# Patient Record
Sex: Female | Born: 1995 | Race: White | Hispanic: No | Marital: Single | Smoking: Never smoker
Health system: Southern US, Community
[De-identification: ages and names within clinical notes are randomized; demographics above are authoritative.]

## PROBLEM LIST (undated history)

## (undated) DIAGNOSIS — M9110 Juvenile osteochondrosis of head of femur [Legg-Calve-Perthes], unspecified leg: Secondary | ICD-10-CM

## (undated) HISTORY — DX: Juvenile osteochondrosis of head of femur (Legg-Calve-Perthes), unspecified leg: M91.10

---
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
## 2017-04-27 ENCOUNTER — Telehealth: Payer: Self-pay | Admitting: General Practice

## 2017-04-27 NOTE — Telephone Encounter (Signed)
Lm with mom to let her know

## 2017-04-27 NOTE — Telephone Encounter (Signed)
I will accept her 

## 2017-04-27 NOTE — Telephone Encounter (Signed)
This pt recently turned 20 and is looking for a pcp. You see her mother Darnell Level(Cathryn Knightly) and older sister. Her mother would like to know if you would see her as a new pt. Please advise.

## 2017-05-14 ENCOUNTER — Encounter: Payer: BC Managed Care – PPO | Admitting: Internal Medicine

## 2017-05-14 ENCOUNTER — Ambulatory Visit (INDEPENDENT_AMBULATORY_CARE_PROVIDER_SITE_OTHER): Payer: BC Managed Care – PPO | Admitting: Internal Medicine

## 2017-05-14 ENCOUNTER — Other Ambulatory Visit (INDEPENDENT_AMBULATORY_CARE_PROVIDER_SITE_OTHER): Payer: BC Managed Care – PPO

## 2017-05-14 ENCOUNTER — Encounter: Payer: Self-pay | Admitting: Internal Medicine

## 2017-05-14 VITALS — BP 110/76 | HR 75 | Temp 97.9°F | Resp 16 | Ht 65.0 in | Wt 139.0 lb

## 2017-05-14 DIAGNOSIS — Z111 Encounter for screening for respiratory tuberculosis: Secondary | ICD-10-CM | POA: Diagnosis not present

## 2017-05-14 DIAGNOSIS — B009 Herpesviral infection, unspecified: Secondary | ICD-10-CM | POA: Diagnosis not present

## 2017-05-14 DIAGNOSIS — M9111 Juvenile osteochondrosis of head of femur [Legg-Calve-Perthes], right leg: Secondary | ICD-10-CM

## 2017-05-14 DIAGNOSIS — Z0000 Encounter for general adult medical examination without abnormal findings: Secondary | ICD-10-CM

## 2017-05-14 DIAGNOSIS — G43009 Migraine without aura, not intractable, without status migrainosus: Secondary | ICD-10-CM

## 2017-05-14 DIAGNOSIS — G43909 Migraine, unspecified, not intractable, without status migrainosus: Secondary | ICD-10-CM | POA: Insufficient documentation

## 2017-05-14 LAB — COMPREHENSIVE METABOLIC PANEL
ALK PHOS: 58 U/L (ref 47–119)
ALT: 10 U/L (ref 0–35)
AST: 18 U/L (ref 0–37)
Albumin: 4.6 g/dL (ref 3.5–5.2)
BUN: 21 mg/dL (ref 6–23)
CALCIUM: 9.9 mg/dL (ref 8.4–10.5)
CO2: 29 mEq/L (ref 19–32)
Chloride: 104 mEq/L (ref 96–112)
Creatinine, Ser: 0.97 mg/dL (ref 0.40–1.20)
GFR: 79.01 mL/min (ref >60.00)
GLUCOSE: 84 mg/dL (ref 70–99)
POTASSIUM: 4.3 meq/L (ref 3.5–5.1)
Sodium: 138 mEq/L (ref 135–145)
TOTAL PROTEIN: 7.5 g/dL (ref 6.0–8.3)
Total Bilirubin: 0.6 mg/dL (ref 0.3–1.2)

## 2017-05-14 LAB — LIPID PANEL
Cholesterol: 151 mg/dL (ref 0–200)
HDL: 67.9 mg/dL (ref >39.00)
LDL Cholesterol: 70 mg/dL (ref 0–99)
NONHDL: 83.07
TRIGLYCERIDES: 67 mg/dL (ref 0.0–149.0)
Total CHOL/HDL Ratio: 2
VLDL: 13.4 mg/dL (ref 0.0–40.0)

## 2017-05-14 LAB — CBC WITH DIFFERENTIAL/PLATELET
Basophils Absolute: 0 10*3/uL (ref 0.0–0.1)
Basophils Relative: 0.4 % (ref 0.0–3.0)
EOS PCT: 1.4 % (ref 0.0–5.0)
Eosinophils Absolute: 0.1 10*3/uL (ref 0.0–0.7)
HEMATOCRIT: 43.5 % (ref 36.0–49.0)
Hemoglobin: 15.1 g/dL (ref 12.0–16.0)
LYMPHS ABS: 2.9 10*3/uL (ref 0.7–4.0)
Lymphocytes Relative: 39.4 % (ref 24.0–48.0)
MCHC: 34.7 g/dL (ref 31.0–37.0)
MCV: 88.1 fl (ref 78.0–98.0)
MONOS PCT: 8.7 % (ref 3.0–12.0)
Monocytes Absolute: 0.6 10*3/uL (ref 0.1–1.0)
NEUTROS ABS: 3.7 10*3/uL (ref 1.4–7.7)
NEUTROS PCT: 50.1 % (ref 43.0–71.0)
PLATELETS: 287 10*3/uL (ref 150.0–575.0)
RBC: 4.93 Mil/uL (ref 3.80–5.70)
RDW: 12.7 % (ref 11.4–15.5)
WBC: 7.4 10*3/uL (ref 4.5–13.5)

## 2017-05-14 LAB — TSH: TSH: 1.61 u[IU]/mL (ref 0.40–5.00)

## 2017-05-14 NOTE — Progress Notes (Signed)
Subjective:    Patient ID: Melissa Summers, female    DOB: 12/11/1996, 21 y.o.   MRN: [id number]  HPI  She is here to establish with a new pcp.  She is here for a physical exam.   She will be starting college in the fall at [employer].  She will study business with a concentration in management.    Migraines:  They usually occur when she gets her periods.  They can last over a day.  They started this year.  She has had increased stress.  She denies aura.  She had nausea.  She has photosensitivity.  Mom thinks it is related to diet. She has taken advil, but can also suffer through it.    Medications and allergies reviewed with patient and updated if appropriate.  Patient Active Problem List   Diagnosis Date Noted  . 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 Perthe's disease of hip     Current Outpatient Prescriptions on File Prior to Visit  Medication Sig Dispense Refill  . penciclovir (DENAVIR) 1 % cream Apply topically every 2 (two) hours. (Patient taking differently: Apply 1 application topically every 2 (two) hours as needed (cold sores). ) 1.5 g 0  . valACYclovir (VALTREX) 500 MG tablet Take 1 tablet (500 mg total) by mouth daily. 30 tablet 12   No current facility-administered medications on file prior to visit.     Past Medical History:  Diagnosis Date  . Perthe's disease of hip right    Past Surgical History:  Procedure Laterality Date  . HIP SURGERY Right 2009   perthes disease    Social History   Social History  . Marital status: Single    Spouse name: N/A  . Number of children: N/A  . Years of education: N/A   Social History Main Topics  . Smoking status: Never Smoker  . Smokeless tobacco: Never Used  . Alcohol use No  . Drug use: No  . Sexual activity: Not Currently    Birth control/ protection: None   Other Topics Concern  . None   Social History Narrative  . None    Family History  Problem Relation Age of Onset  . Adopted: Yes    Review of  Systems  Constitutional: Negative for chills and fever.  Eyes: Negative for visual disturbance.  Respiratory: Negative for cough, shortness of breath and wheezing.   Cardiovascular: Negative for chest pain, palpitations and leg swelling.  Gastrointestinal: Positive for nausea (migraines). Negative for abdominal pain, blood in stool, constipation and diarrhea.  Genitourinary: Negative for dysuria and hematuria.  Musculoskeletal: Positive for arthralgias (right hip). Negative for back pain.  Skin: Negative for color change and rash.  Neurological: Positive for headaches. Negative for dizziness and light-headedness.  Psychiatric/Behavioral: Negative for dysphoric mood and sleep disturbance. The patient is not nervous/anxious.        Objective:   Vitals:   05/14/17 1544  BP: 110/76  Pulse: 75  Resp: 16  Temp: 97.9 F (36.6 C)   Filed Weights   05/14/17 1544  Weight: 139 lb (63 kg)   Body mass index is 23.13 kg/m.  Wt Readings from Last 3 Encounters:  05/14/17 139 lb (63 kg) (72 %, Z= 0.59)*  01/13/15 150 lb (68 kg) (87 %, Z= 1.13)*  04/06/13 154 lb (69.9 kg) (93 %, Z= 1.44)*   * Growth percentiles are based on CDC 2-20 Years data.     Physical Exam Constitutional: She appears well-developed and well-nourished.  No distress.  HENT:  Head: Normocephalic and atraumatic.  Right Ear: External ear normal. Normal ear canal and TM Left Ear: External ear normal.  Normal ear canal and TM Mouth/Throat: Oropharynx is clear and moist.  Eyes: Conjunctivae and EOM are normal.  Neck: Neck supple. No tracheal deviation present. No thyromegaly present.  No carotid bruit  Cardiovascular: Normal rate, regular rhythm and normal heart sounds.   No murmur heard.  No edema. Pulmonary/Chest: Effort normal and breath sounds normal. No respiratory distress. She has no wheezes. She has no rales.  Breast: deferred  Abdominal: Soft. She exhibits no distension. There is no tenderness.    Lymphadenopathy: She has no cervical adenopathy.  Skin: Skin is warm and dry. She is not diaphoretic.  Psychiatric: She has a normal mood and affect. Her behavior is normal.       Assessment & Plan:   Physical exam: Screening blood work ordered Immunizations  Up to date Gyn  - no currently seeing Exercise  Regular - ice skating Weight  Normal BMI Skin no concerns Substance abuse   none  See Problem List for Assessment and Plan of chronic medical problems.   FU prn

## 2017-05-14 NOTE — Assessment & Plan Note (Addendum)
Flares with activity and weather Has intermittent pain Ices the area [place] in [place]

## 2017-05-14 NOTE — Patient Instructions (Addendum)
Test(s) ordered today. Your results will be released to MyChart (or called to you) after review, usually within 72hours after test completion. If any changes need to be made, you will be notified at that same time.  All other Health Maintenance issues reviewed.   All recommended immunizations and age-appropriate screenings are up-to-date or discussed.  No immunizations administered today.   Medications reviewed and updated.  No changes recommended at this time.        Migraine Headache A migraine headache is an intense, throbbing pain on one side or both sides of the head. Migraines may also cause other symptoms, such as nausea, vomiting, and sensitivity to light and noise. What are the causes? Doing or taking certain things may also trigger migraines, such as:  Alcohol.  Smoking.  Medicines, such as: ? Medicine used to treat chest pain (nitroglycerine). ? Birth control pills. ? Estrogen pills. ? Certain blood pressure medicines.  Aged cheeses, chocolate, or caffeine.  Foods or drinks that contain nitrates, glutamate, aspartame, or tyramine.  Physical activity.  Other things that may trigger a migraine include:  Menstruation.  Pregnancy.  Hunger.  Stress, lack of sleep, too much sleep, or fatigue.  Weather changes.  What increases the risk? The following factors may make you more likely to experience migraine headaches:  Age. Risk increases with age.  Family history of migraine headaches.  Being Caucasian.  Depression and anxiety.  Obesity.  Being a woman.  Having a hole in the heart (patent foramen ovale) or other heart problems.  What are the signs or symptoms? The main symptom of this condition is pulsating or throbbing pain. Pain may:  Happen in any area of the head, such as on one side or both sides.  Interfere with daily activities.  Get worse with physical activity.  Get worse with exposure to bright lights or loud noises.  Other  symptoms may include:  Nausea.  Vomiting.  Dizziness.  General sensitivity to bright lights, loud noises, or smells.  Before you get a migraine, you may get warning signs that a migraine is developing (aura). An aura may include:  Seeing flashing lights or having blind spots.  Seeing bright spots, halos, or zigzag lines.  Having tunnel vision or blurred vision.  Having numbness or a tingling feeling.  Having trouble talking.  Having muscle weakness.  How is this diagnosed? A migraine headache can be diagnosed based on:  Your symptoms.  A physical exam.  Tests, such as CT scan or MRI of the head. These imaging tests can help rule out other causes of headaches.  Taking fluid from the spine (lumbar puncture) and analyzing it (cerebrospinal fluid analysis, or CSF analysis).  How is this treated? A migraine headache is usually treated with medicines that:  Relieve pain.  Relieve nausea.  Prevent migraines from coming back.  Treatment may also include:  Acupuncture.  Lifestyle changes like avoiding foods that trigger migraines.  Follow these instructions at home: Medicines  Take over-the-counter and prescription medicines only as told by your health care provider.  Do not drive or use heavy machinery while taking prescription pain medicine.  To prevent or treat constipation while you are taking prescription pain medicine, your health care provider may recommend that you: ? Drink enough fluid to keep your urine clear or pale yellow. ? Take over-the-counter or prescription medicines. ? Eat foods that are high in fiber, such as fresh fruits and vegetables, whole grains, and beans. ? Limit foods that are high  in fat and processed sugars, such as fried and sweet foods. Lifestyle  Avoid alcohol use.  Do not use any products that contain nicotine or tobacco, such as cigarettes and e-cigarettes. If you need help quitting, ask your health care provider.  Get at  least 8 hours of sleep every night.  Limit your stress. General instructions   Keep a journal to find out what may trigger your migraine headaches. For example, write down: ? What you eat and drink. ? How much sleep you get. ? Any change to your diet or medicines.  If you have a migraine: ? Avoid things that make your symptoms worse, such as bright lights. ? It may help to lie down in a dark, quiet room. ? Do not drive or use heavy machinery. ? Ask your health care provider what activities are safe for you while you are experiencing symptoms.  Keep all follow-up visits as told by your health care provider. This is important. Contact a health care provider if:  You develop symptoms that are different or more severe than your usual migraine symptoms. Get help right away if:  Your migraine becomes severe.  You have a fever.  You have a stiff neck.  You have vision loss.  Your muscles feel weak or like you cannot control them.  You start to lose your balance often.  You develop trouble walking.  You faint. This information is not intended to replace advice given to you by your health care provider. Make sure you discuss any questions you have with your health care provider. Document Released: 11/30/2005 Document Revised: 06/19/2016 Document Reviewed: 05/18/2016 Elsevier Interactive Patient Education  2017 [employer]

## 2017-05-14 NOTE — Progress Notes (Signed)
    Subjective:    Patient ID: Melissa Summers, female    DOB: 19-Sep-1996, 19 y.o.   MRN: [id number]  HPI No show  Medications and allergies reviewed with patient and updated if appropriate.  Patient Active Problem List   Diagnosis Date Noted  . Perthe's disease of hip     Current Outpatient Prescriptions on File Prior to Visit  Medication Sig Dispense Refill  . penciclovir (DENAVIR) 1 % cream Apply topically every 2 (two) hours. (Patient taking differently: Apply 1 application topically every 2 (two) hours as needed (cold sores). ) 1.5 g 0  . valACYclovir (VALTREX) 500 MG tablet Take 1 tablet (500 mg total) by mouth daily. (Patient not taking: Reported on 01/13/2015) 30 tablet 12   No current facility-administered medications on file prior to visit.     Past Medical History:  Diagnosis Date  . Perthe's disease of hip right    Past Surgical History:  Procedure Laterality Date  . HIP SURGERY Right 2009   perthes disease    Social History   Social History  . Marital status: Single    Spouse name: N/A  . Number of children: N/A  . Years of education: N/A   Social History Main Topics  . Smoking status: Never Smoker  . Smokeless tobacco: Not on file  . Alcohol use No  . Drug use: No  . Sexual activity: Not Currently    Birth control/ protection: None   Other Topics Concern  . Not on file   Social History Narrative  . No narrative on file    Family History  Problem Relation Age of Onset  . Adopted: Yes    Review of Systems     Objective:  There were no vitals filed for this visit. There were no vitals filed for this visit. There is no height or weight on file to calculate BMI.  Wt Readings from Last 3 Encounters:  01/13/15 150 lb (68 kg) (87 %, Z= 1.13)*  04/06/13 154 lb (69.9 kg) (93 %, Z= 1.44)*   * Growth percentiles are based on CDC 2-20 Years data.     Physical Exam       Assessment & Plan:       This encounter was created in error  - please disregard.

## 2017-05-15 NOTE — Assessment & Plan Note (Signed)
Gets cold sores intermittently

## 2017-05-15 NOTE — Assessment & Plan Note (Signed)
?   Hormonal, fatigue, stress, dehydration, diet Discussed possible causes and treatment Advised her to try to identify cause She will try excedrin migraine If not effective we can consider a triptan

## 2017-05-17 ENCOUNTER — Telehealth: Payer: Self-pay

## 2017-05-17 ENCOUNTER — Ambulatory Visit (INDEPENDENT_AMBULATORY_CARE_PROVIDER_SITE_OTHER): Payer: BC Managed Care – PPO | Admitting: Emergency Medicine

## 2017-05-17 DIAGNOSIS — Z23 Encounter for immunization: Secondary | ICD-10-CM | POA: Diagnosis not present

## 2017-05-17 LAB — TB SKIN TEST
Induration: 0 mm
TB Skin Test: NEGATIVE

## 2017-05-17 LAB — HSV 2 ANTIBODY, IGG

## 2017-05-17 LAB — HSV 1 ANTIBODY, IGG: HSV 1 GLYCOPROTEIN G AB, IGG: 55.8 {index} — AB (ref <0.90)

## 2017-05-17 MED ORDER — MENINGOCOCCAL A C Y&W-135 OLIG IM SOLR: 0.5000 mL | Freq: Once | INTRAMUSCULAR | Status: DC

## 2017-05-17 MED ORDER — MENINGOCOCCAL A C Y&W-135 OLIG IM SOLR
0.5000 mL | Freq: Once | INTRAMUSCULAR | Status: AC
  Administered 2017-05-17: 0.5 mL via INTRAMUSCULAR

## 2017-05-17 NOTE — Progress Notes (Signed)
Injections given.   Gene Glazebrook J Danniela Mcbrearty, MD  

## 2017-05-17 NOTE — Telephone Encounter (Signed)
Per dr hunter/brassfield and dr tabori-[place]---ok to give 2 live vaccines on same day---those today being bexsero and menveo---I have advised charlotte, NP who was working with dr burns on this

## 2017-05-18 ENCOUNTER — Encounter: Payer: Self-pay | Admitting: Emergency Medicine

## 2017-09-16 ENCOUNTER — Ambulatory Visit (INDEPENDENT_AMBULATORY_CARE_PROVIDER_SITE_OTHER): Payer: BC Managed Care – PPO | Admitting: Emergency Medicine

## 2017-09-16 DIAGNOSIS — Z23 Encounter for immunization: Secondary | ICD-10-CM

## 2017-10-29 ENCOUNTER — Telehealth: Payer: Self-pay | Admitting: Internal Medicine

## 2017-10-29 NOTE — Telephone Encounter (Signed)
Pt called and her Excedrin is not working and she would like the migraine meds that were discussed in July, Pt is aware she may need an appointment, please call back in regard

## 2017-11-01 MED ORDER — RIZATRIPTAN BENZOATE 5 MG PO TABS: 5.0000 mg | ORAL_TABLET | ORAL | 0 refills | Status: DC | PRN

## 2017-11-01 NOTE — Telephone Encounter (Signed)
rx sent to [facility].  Let her know there are other options and there is a stronger dose.    The medication may make her tired, cause dizziness or chest tightness.   If she was to get pregnant she can not take the medication.

## 2017-11-01 NOTE — Telephone Encounter (Signed)
Spoke with pt to inform.  

## 2018-01-26 ENCOUNTER — Telehealth: Payer: Self-pay | Admitting: Internal Medicine

## 2018-01-26 ENCOUNTER — Other Ambulatory Visit: Payer: Self-pay

## 2018-01-26 MED ORDER — RIZATRIPTAN BENZOATE 5 MG PO TABS: 5.0000 mg | ORAL_TABLET | ORAL | 0 refills | Status: DC | PRN

## 2018-01-26 NOTE — Telephone Encounter (Signed)
Copied from CRM [phone]. Topic: Quick Communication - Rx Refill/Question >> Jan 26, 2018  1:25 PM Eston Mouldavis, Cira Deyoe B wrote: Medication:rizatriptan (MAXALT) 5 MG tablet   Has the patient contacted their pharmacy? No  no refills   (Agent: If no, request that the patient contact the pharmacy for the refill.)   Preferred Pharmacy (with phone number or street name): [facility] - [place], [place] - [address] [phone] (Phone) [phone] (Fax)    Agent: Please be advised that RX refills may take up to 3 business days. We ask that you follow-up with your pharmacy.

## 2018-06-29 ENCOUNTER — Encounter: Payer: Self-pay | Admitting: Internal Medicine

## 2018-06-29 ENCOUNTER — Ambulatory Visit: Payer: BC Managed Care – PPO | Admitting: Internal Medicine

## 2018-06-29 DIAGNOSIS — G43009 Migraine without aura, not intractable, without status migrainosus: Secondary | ICD-10-CM | POA: Diagnosis not present

## 2018-06-29 DIAGNOSIS — R509 Fever, unspecified: Secondary | ICD-10-CM | POA: Diagnosis not present

## 2018-06-29 DIAGNOSIS — R112 Nausea with vomiting, unspecified: Secondary | ICD-10-CM | POA: Diagnosis not present

## 2018-06-29 LAB — POCT RAPID STREP A (OFFICE): RAPID STREP A SCREEN: NEGATIVE

## 2018-06-29 MED ORDER — KETOROLAC TROMETHAMINE 60 MG/2ML IM SOLN
60.0000 mg | Freq: Once | INTRAMUSCULAR | Status: AC
  Administered 2018-06-29: 60 mg via INTRAMUSCULAR

## 2018-06-29 MED ORDER — AZITHROMYCIN 250 MG PO TABS: ORAL_TABLET | ORAL | 0 refills | Status: DC

## 2018-06-29 MED ORDER — ONDANSETRON HCL 4 MG PO TABS: 4.0000 mg | ORAL_TABLET | Freq: Three times a day (TID) | ORAL | 0 refills | Status: DC | PRN

## 2018-06-29 MED ORDER — IBUPROFEN 600 MG PO TABS: ORAL_TABLET | ORAL | 0 refills | Status: DC

## 2018-06-29 NOTE — Assessment & Plan Note (Signed)
Zofran Hold BCP Labs

## 2018-06-29 NOTE — Assessment & Plan Note (Addendum)
Worse Toradol IM Ibuprofen prn Hold BCP

## 2018-06-29 NOTE — Progress Notes (Signed)
Subjective:  Patient ID: Melissa Summers, female    DOB: 1996/11/11  Age: 22 y.o. MRN: [id number]  CC: No chief complaint on file.   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 penciclovir (DENAVIR) 1 % cream Apply topically every 2 (two) hours. (Patient taking differently: Apply 1 application topically every 2 (two) hours as needed (cold sores). ) 1.5 g 0  . rizatriptan (MAXALT) 5 MG tablet Take 1 tablet (5 mg total) by mouth as needed for migraine. May repeat in 2 hours if needed 10 tablet 0  . TRI-SPRINTEC 0.18/0.215/0.25 MG-35 MCG tablet TK 1 T PO QD  3  . valACYclovir (VALTREX) 500 MG tablet Take 1 tablet (500 mg total) by mouth daily. 30 tablet 12   No facility-administered medications prior to visit.     ROS: Review of Systems  Constitutional: Positive for chills and fatigue. Negative for activity change, appetite change and unexpected weight change.  HENT: Positive for sore throat. Negative for congestion, mouth sores and sinus pressure.   Eyes: Negative for visual disturbance.  Respiratory: Negative for cough and chest tightness.   Gastrointestinal: Positive for nausea and vomiting. Negative for abdominal pain and diarrhea.  Genitourinary: Negative for difficulty urinating, frequency and vaginal pain.  Musculoskeletal: Negative for back pain and gait problem.  Skin: Negative for pallor and rash.  Neurological: Positive for headaches. Negative for dizziness, tremors, weakness and numbness.  Psychiatric/Behavioral: Negative for confusion and sleep disturbance. The patient is not nervous/anxious.     Objective:  BP 116/78 (BP Location: Left Arm, Patient Position: Sitting, Cuff Size: Normal)   Pulse 87   Temp 99.3 F (37.4 C) (Oral)   Ht 5' 5.04" (1.652 m)    Wt 143 lb (64.9 kg)   SpO2 98%   BMI 23.77 kg/m   BP Readings from Last 3 Encounters:  06/29/18 116/78  05/14/17 110/76  01/13/15 108/66 (44 %, Z = -0.15 /  49 %, Z = -0.02)*   *BP percentiles are based on the August 2017 AAP Clinical Practice Guideline for girls    Wt Readings from Last 3 Encounters:  06/29/18 143 lb (64.9 kg) (73 %, Z= 0.62)*  05/14/17 139 lb (63 kg) (72 %, Z= 0.59)*  01/13/15 150 lb (68 kg) (87 %, Z= 1.13)*   * Growth percentiles are based on CDC (Girls, 2-20 Years) data.    Physical Exam  Constitutional: She appears well-developed. No distress.  HENT:  Head: Normocephalic.  Right Ear: External ear normal.  Left Ear: External ear normal.  Nose: Nose normal.  Mouth/Throat: Oropharynx is clear and moist.  Eyes: Pupils are equal, round, and reactive to light. Conjunctivae are normal. Right eye exhibits no discharge. Left eye exhibits no discharge.  Neck: Normal range of motion. Neck supple. No JVD present. No tracheal deviation present. No thyromegaly present.  Cardiovascular: Normal rate, regular rhythm and normal heart sounds.  Pulmonary/Chest: No stridor. No respiratory distress. She has no wheezes.  Abdominal: Soft. Bowel sounds are normal. She exhibits no distension and no mass. There is no tenderness. There is no rebound and no guarding.  Musculoskeletal: She exhibits no edema or tenderness.  Lymphadenopathy:    She has no cervical adenopathy.  Neurological: She displays normal reflexes.  No cranial nerve deficit. She exhibits normal muscle tone. Coordination normal.  Skin: No rash noted. No erythema.  Psychiatric: She has a normal mood and affect. Her behavior is normal. Judgment and thought content normal.  NAD, nontoxic (-) meningeal signs No HSM cerv adenopathy (+)  Lab Results  Component Value Date   WBC 7.4 05/14/2017   HGB 15.1 05/14/2017   HCT 43.5 05/14/2017   PLT 287.0 05/14/2017   GLUCOSE 84 05/14/2017   CHOL 151 05/14/2017   TRIG  67.0 05/14/2017   HDL 67.90 05/14/2017   LDLCALC 70 05/14/2017   ALT 10 05/14/2017   AST 18 05/14/2017   NA 138 05/14/2017   K 4.3 05/14/2017   CL 104 05/14/2017   CREATININE 0.97 05/14/2017   BUN 21 05/14/2017   CO2 29 05/14/2017   TSH 1.61 05/14/2017    Dg Chest 2 View  Result Date: 01/13/2015 CLINICAL DATA:  Central chest pain, shortness of breath. Symptoms for 3 hr. EXAM: CHEST  2 VIEW COMPARISON:  None. FINDINGS: The cardiomediastinal contours are normal. The lungs are clear. Pulmonary vasculature is normal. No consolidation, pleural effusion, or pneumothorax. No acute osseous abnormalities are seen. IMPRESSION: No acute pulmonary process. Electronically Signed   By: Rubye OaksMelanie  Ehinger M.D.   On: 01/13/2015 23:42    Assessment & Plan:   There are no diagnoses linked to this encounter.   No orders of the defined types were placed in this encounter.    Follow-up: No follow-ups on file.  Sonda PrimesAlex Jabree Pernice, MD

## 2018-06-29 NOTE — Patient Instructions (Signed)
You can use over-the-counter  "cold" medicines  such as "Tylenol cold" , "Advil cold",  "Mucinex" or" Mucinex D"  for cough and congestion.    Use " Delsym" or" Robitussin" cough syrup varietis for cough.  You can use plain "Tylenol" or "Advil" for fever, chills and achyness. Use Halls or Ricola cough drops.   "Viral cold" symptoms are usually triggered by a virus.  The antibiotics are usually not necessary. On average, a" viral cold" illness would take 4-7 days to resolve.   Please, make an appointment if you are not better or if you're worse.  Stop BCP  Go to ER if worse

## 2018-06-29 NOTE — Assessment & Plan Note (Addendum)
Strep test (+) Zpack Mono, CBC, CMET - if not better

## 2018-11-07 ENCOUNTER — Other Ambulatory Visit: Payer: Self-pay | Admitting: Internal Medicine

## 2019-04-27 ENCOUNTER — Other Ambulatory Visit: Payer: Self-pay | Admitting: Internal Medicine

## 2019-05-07 ENCOUNTER — Other Ambulatory Visit: Payer: Self-pay | Admitting: Internal Medicine

## 2019-05-09 NOTE — Telephone Encounter (Signed)
States she would like to speak with her mom before scheduling.  States will call back to schedule virtual med fu.

## 2019-05-09 NOTE — Telephone Encounter (Signed)
Patient needs visit. Last OV with PCP was 2018.

## 2019-05-09 NOTE — Telephone Encounter (Signed)
Patients mom called 5/24 at 10:24 am stating that patient was out of refills.

## 2019-12-19 ENCOUNTER — Ambulatory Visit: Payer: BC Managed Care – PPO | Attending: Internal Medicine

## 2019-12-19 DIAGNOSIS — Z20822 Contact with and (suspected) exposure to covid-19: Secondary | ICD-10-CM

## 2019-12-21 ENCOUNTER — Ambulatory Visit (INDEPENDENT_AMBULATORY_CARE_PROVIDER_SITE_OTHER): Payer: BC Managed Care – PPO | Admitting: Internal Medicine

## 2019-12-21 ENCOUNTER — Ambulatory Visit: Payer: BC Managed Care – PPO | Admitting: Internal Medicine

## 2019-12-21 ENCOUNTER — Encounter: Payer: Self-pay | Admitting: Internal Medicine

## 2019-12-21 ENCOUNTER — Other Ambulatory Visit: Payer: Self-pay

## 2019-12-21 DIAGNOSIS — H10021 Other mucopurulent conjunctivitis, right eye: Secondary | ICD-10-CM | POA: Diagnosis not present

## 2019-12-21 LAB — NOVEL CORONAVIRUS, NAA: SARS-CoV-2, NAA: DETECTED — AB

## 2019-12-21 MED ORDER — ERYTHROMYCIN 5 MG/GM OP OINT: 1.0000 "application " | TOPICAL_OINTMENT | Freq: Every day | OPHTHALMIC | 0 refills | Status: DC

## 2019-12-21 NOTE — Progress Notes (Signed)
Virtual Visit via Video Note  I connected with Melissa Summers on 12/21/19 at  4:00 PM EST by a video enabled telemedicine application and verified that I am speaking with the correct person using two identifiers.  The patient and the provider were at separate locations throughout the entire encounter.   I discussed the limitations of evaluation and management by telemedicine and the availability of in person appointments. The patient expressed understanding and agreed to proceed. The patient and the provider were the only parties present for the visit unless noted in HPI below.  History of Present Illness: The patient is a 24 y.o. female with visit for concern about pink eye. Started yesterday with some redness and itching in the eye. Had drainage which she had to wipe away this morning. Has contacts but using glasses only since this started. Denies pain in the eye. Denies loss of vision. Overall it is worsening. Has tried nothing  Observations/Objective: Appearance: normal, breathing appears normal, right eye with some pinkness in the sclera, no noticeable crusting, glasses, left eye looks fairly normal, casual grooming, abdomen does not appear distended, throat normal, memory normal, mental status is A and O times 3  Assessment and Plan: See problem oriented charting  Follow Up Instructions: rx erythromycin ointment   I discussed the assessment and treatment plan with the patient. The patient was provided an opportunity to ask questions and all were answered. The patient agreed with the plan and demonstrated an understanding of the instructions.   The patient was advised to call back or seek an in-person evaluation if the symptoms worsen or if the condition fails to improve as anticipated.  Myrlene Broker, MD

## 2019-12-22 DIAGNOSIS — H10029 Other mucopurulent conjunctivitis, unspecified eye: Secondary | ICD-10-CM | POA: Insufficient documentation

## 2019-12-22 NOTE — Assessment & Plan Note (Signed)
Rx erythromycin ointment for new problem. Will apply to both eyes as she feels starting itching in the left although no redness on exam. Advised to discard contacts she wore yesterday. Wear glasses until fully resolved. Then start with new contacts.

## 2020-01-08 ENCOUNTER — Telehealth: Payer: BC Managed Care – PPO | Admitting: Nurse Practitioner

## 2020-01-08 DIAGNOSIS — J029 Acute pharyngitis, unspecified: Secondary | ICD-10-CM

## 2020-01-08 MED ORDER — CLINDAMYCIN HCL 300 MG PO CAPS: 300.0000 mg | ORAL_CAPSULE | Freq: Three times a day (TID) | ORAL | 0 refills | Status: DC

## 2020-01-08 NOTE — Progress Notes (Signed)
We are sorry that you are not feeling well.  Here is how we plan to help!  Based on what you have shared with me it is likely that you have strep pharyngitis.  Strep pharyngitis is inflammation and infection in the back of the throat.  This is an infection cause by bacteria and is treated with antibiotics.  I have prescribed Clindamycin 300 mg three times a day for 10 days. For throat pain, we recommend over the counter oral pain relief medications such as acetaminophen or aspirin, or anti-inflammatory medications such as ibuprofen or naproxen sodium. Topical treatments such as oral throat lozenges or sprays may be used as needed. Strep infections are not as easily transmitted as other respiratory infections, however we still recommend that you avoid close contact with loved ones, especially the very young and elderly.  Remember to wash your hands thoroughly throughout the day as this is the number one way to prevent the spread of infection and wipe down door knobs and counters with disinfectant.   Home Care:  Only take medications as instructed by your medical team.  Complete the entire course of an antibiotic.  Do not take these medications with alcohol.  A steam or ultrasonic humidifier can help congestion.  You can place a towel over your head and breathe in the steam from hot water coming from a faucet.  Avoid close contacts especially the very young and the elderly.  Cover your mouth when you cough or sneeze.  Always remember to wash your hands.  Get Help Right Away If:  You develop worsening fever or sinus pain.  You develop a severe head ache or visual changes.  Your symptoms persist after you have completed your treatment plan.  Make sure you  Understand these instructions.  Will watch your condition.  Will get help right away if you are not doing well or get worse.  Your e-visit answers were reviewed by a board certified advanced clinical practitioner to complete your  personal care plan.  Depending on the condition, your plan could have included both over the counter or prescription medications.  If there is a problem please reply  once you have received a response from your provider.  Your safety is important to [place].  If you have drug allergies check your prescription carefully.    You can use MyChart to ask questions about today's visit, request a non-urgent call back, or ask for a work or school excuse for 24 hours related to this e-Visit. If it has been greater than 24 hours you will need to follow up with your provider, or enter a new e-Visit to address those concerns.  You will get an e-mail in the next two days asking about your experience.  I hope that your e-visit has been valuable and will speed your recovery. Thank you for using e-visits.   5-10 minutes spent reviewing and documenting in chart.

## 2020-03-16 ENCOUNTER — Ambulatory Visit: Payer: BC Managed Care – PPO | Attending: Internal Medicine

## 2020-03-16 DIAGNOSIS — Z23 Encounter for immunization: Secondary | ICD-10-CM

## 2020-03-16 NOTE — Progress Notes (Signed)
   Covid-19 Vaccination [facility]  Name:  Valynn Schamberger    MRN: [id number] DOB: [birth year]  03/16/2020  Ms. Hersman was observed post Covid-19 immunization for 15 minutes without incident. She was provided with Vaccine Information Sheet and instruction to access the V-Safe system.   Ms. Basque was instructed to call 911 with any severe reactions post vaccine: Marland Kitchen Difficulty breathing  . Swelling of face and throat  . A fast heartbeat  . A bad rash all over body  . Dizziness and weakness   Immunizations Administered    Name Date Dose VIS Date Route   Pfizer COVID-19 Vaccine 03/16/2020  4:38 PM 0.3 mL 11/24/2019 Intramuscular   Manufacturer: [employer], [employer]   Lot: [id number]   NDC: [address]-3

## 2020-04-09 ENCOUNTER — Ambulatory Visit: Payer: BC Managed Care – PPO

## 2020-04-15 ENCOUNTER — Ambulatory Visit: Payer: BC Managed Care – PPO

## 2020-08-07 ENCOUNTER — Other Ambulatory Visit: Payer: Self-pay | Admitting: Nurse Practitioner

## 2021-02-25 ENCOUNTER — Other Ambulatory Visit: Payer: Self-pay

## 2021-02-25 ENCOUNTER — Encounter: Payer: Self-pay | Admitting: Internal Medicine

## 2021-02-25 ENCOUNTER — Ambulatory Visit (INDEPENDENT_AMBULATORY_CARE_PROVIDER_SITE_OTHER): Payer: BC Managed Care – PPO | Admitting: Internal Medicine

## 2021-02-25 VITALS — BP 108/70 | HR 80 | Temp 98.3°F | Ht 65.0 in | Wt 157.0 lb

## 2021-02-25 DIAGNOSIS — G43009 Migraine without aura, not intractable, without status migrainosus: Secondary | ICD-10-CM | POA: Diagnosis not present

## 2021-02-25 DIAGNOSIS — Z0000 Encounter for general adult medical examination without abnormal findings: Secondary | ICD-10-CM

## 2021-02-25 DIAGNOSIS — Z114 Encounter for screening for human immunodeficiency virus [HIV]: Secondary | ICD-10-CM

## 2021-02-25 DIAGNOSIS — N920 Excessive and frequent menstruation with regular cycle: Secondary | ICD-10-CM

## 2021-02-25 DIAGNOSIS — Z1159 Encounter for screening for other viral diseases: Secondary | ICD-10-CM

## 2021-02-25 LAB — COMPREHENSIVE METABOLIC PANEL
ALT: 15 U/L (ref 0–35)
AST: 20 U/L (ref 0–37)
Albumin: 4.1 g/dL (ref 3.5–5.2)
Alkaline Phosphatase: 50 U/L (ref 39–117)
BUN: 11 mg/dL (ref 6–23)
CO2: 27 mEq/L (ref 19–32)
Calcium: 9.4 mg/dL (ref 8.4–10.5)
Chloride: 105 mEq/L (ref 96–112)
Creatinine, Ser: 0.87 mg/dL (ref 0.40–1.20)
GFR: 94.63 mL/min (ref >60.00)
Glucose, Bld: 89 mg/dL (ref 70–99)
Potassium: 3.9 mEq/L (ref 3.5–5.1)
Sodium: 138 mEq/L (ref 135–145)
Total Bilirubin: 0.8 mg/dL (ref 0.2–1.2)
Total Protein: 7.1 g/dL (ref 6.0–8.3)

## 2021-02-25 LAB — CBC WITH DIFFERENTIAL/PLATELET
Basophils Absolute: 0 10*3/uL (ref 0.0–0.1)
Basophils Relative: 0.5 % (ref 0.0–3.0)
Eosinophils Absolute: 0.1 10*3/uL (ref 0.0–0.7)
Eosinophils Relative: 2 % (ref 0.0–5.0)
HCT: 40.1 % (ref 36.0–46.0)
Hemoglobin: 14.2 g/dL (ref 12.0–15.0)
Lymphocytes Relative: 48 % — ABNORMAL HIGH (ref 12.0–46.0)
Lymphs Abs: 1.9 10*3/uL (ref 0.7–4.0)
MCHC: 35.3 g/dL (ref 30.0–36.0)
MCV: 90.4 fl (ref 78.0–100.0)
Monocytes Absolute: 0.3 10*3/uL (ref 0.1–1.0)
Monocytes Relative: 8.7 % (ref 3.0–12.0)
Neutro Abs: 1.6 10*3/uL (ref 1.4–7.7)
Neutrophils Relative %: 40.8 % — ABNORMAL LOW (ref 43.0–77.0)
Platelets: 245 10*3/uL (ref 150.0–400.0)
RBC: 4.44 Mil/uL (ref 3.87–5.11)
RDW: 12.8 % (ref 11.5–15.5)
WBC: 4 10*3/uL (ref 4.0–10.5)

## 2021-02-25 LAB — LIPID PANEL
Cholesterol: 141 mg/dL (ref 0–200)
HDL: 72.8 mg/dL (ref >39.00)
LDL Cholesterol: 58 mg/dL (ref 0–99)
NonHDL: 68.53
Total CHOL/HDL Ratio: 2
Triglycerides: 55 mg/dL (ref 0.0–149.0)
VLDL: 11 mg/dL (ref 0.0–40.0)

## 2021-02-25 LAB — TSH: TSH: 1.63 u[IU]/mL (ref 0.35–4.50)

## 2021-02-25 MED ORDER — VIENVA 0.1-20 MG-MCG PO TABS: 1.0000 | ORAL_TABLET | Freq: Every day | ORAL | 3 refills | Status: AC

## 2021-02-25 NOTE — Progress Notes (Addendum)
Subjective:    Patient ID: Melissa Summers, female    DOB: 03-10-1996, 25 y.o.   MRN: [id number]   This visit occurred during the SARS-CoV-2 public health emergency.  Safety protocols were in place, including screening questions prior to the visit, additional usage of staff PPE, and extensive cleaning of exam room while observing appropriate contact time as indicated for disinfecting solutions.    HPI She is here for a physical exam.   Had had heavy periods for a while.  They have gotten better recently.  She does worry about her blood counts and iron levels.  She is taking her birth control.  She would like to ideally get a refill.  She is up-to-date with her gynecologist  Medications and allergies reviewed with patient and updated if appropriate.  Patient Active Problem List   Diagnosis Date Noted  . Nausea & vomiting 06/29/2018  . 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 Migraines 05/14/2017  . Juvenile osteochondrosis of head of femur     Current Outpatient Medications on File Prior to Visit  Medication Sig Dispense Refill  . VIENVA 0.1-20 MG-MCG tablet Take 1 tablet by mouth daily.     No current facility-administered medications on file prior to visit.    Past Medical History:  Diagnosis Date  . Perthe's disease of hip right    Past Surgical History:  Procedure Laterality Date  . HIP SURGERY Right 2009   perthes disease    Social History   Socioeconomic History  . Marital status: Single    Spouse name: Not on file  . Number of children: Not on file  . Years of education: Not on file  . Highest education level: Not on file  Occupational History  . Not on file  Tobacco Use  . Smoking status: Never Smoker  . Smokeless tobacco: Never Used  Substance and Sexual Activity  . Alcohol use: No  . Drug use: No  . Sexual activity: Not Currently    Birth control/protection: None  Other Topics Concern  . Not on file  Social History Narrative  . Not on file   Social  Determinants of Health   Financial Resource Strain: Not on file  Food Insecurity: Not on file  Transportation Needs: Not on file  Physical Activity: Not on file  Stress: Not on file  Social Connections: Not on file    Family History  Adopted: Yes    Review of Systems  Constitutional: Negative for chills and fever.  Eyes: Negative for visual disturbance.  Respiratory: Negative for cough, shortness of breath and wheezing.   Cardiovascular: Negative for chest pain, palpitations and leg swelling.  Gastrointestinal: Negative for abdominal pain, blood in stool, constipation, diarrhea and nausea.       No gerd  Genitourinary: Positive for menstrual problem (heavy). Negative for dysuria.  Musculoskeletal: Positive for arthralgias (hip). Negative for back pain.  Skin: Negative for color change and rash.  Neurological: Positive for headaches (occ). Negative for light-headedness.  Psychiatric/Behavioral: Negative for dysphoric mood. The patient is nervous/anxious.        Objective:   Vitals:   02/25/21 0900  BP: 108/70  Pulse: 80  Temp: 98.3 F (36.8 C)  SpO2: 97%   Filed Weights   02/25/21 0900  Weight: 157 lb (71.2 kg)   Body mass index is 26.13 kg/m.  BP Readings from Last 3 Encounters:  02/25/21 108/70  06/29/18 116/78  05/14/17 110/76    Wt Readings from Last 3 Encounters:  02/25/21 157 lb (71.2 kg)  06/29/18 143 lb (64.9 kg) (73 %, Z= 0.62)*  05/14/17 139 lb (63 kg) (72 %, Z= 0.59)*   * Growth percentiles are based on CDC (Girls, 2-20 Years) data.    Depression screen PHQ 2/9 02/25/2021  Decreased Interest 1  Down, Depressed, Hopeless 0  PHQ - 2 Score 1  Altered sleeping 1  Tired, decreased energy 1  Change in appetite 2  Feeling bad or failure about yourself  0  Trouble concentrating 0  Moving slowly or fidgety/restless 0  Suicidal thoughts 0  PHQ-9 Score 5    GAD 7 : Generalized Anxiety Score 02/25/2021  Nervous, Anxious, on Edge 2  Control/stop  worrying 2  Worry too much - different things 2  Trouble relaxing 1  Restless 0  Easily annoyed or irritable 2  Afraid - awful might happen 0  Total GAD 7 Score 9  Anxiety Difficulty Not difficult at all       Physical Exam Constitutional: She appears well-developed and well-nourished. No distress.  HENT:  Head: Normocephalic and atraumatic.  Right Ear: External ear normal. Normal ear canal and TM Left Ear: External ear normal.  Normal ear canal and TM Mouth/Throat: Oropharynx is clear and moist.  Eyes: Conjunctivae and EOM are normal.  Neck: Neck supple. No tracheal deviation present. No thyromegaly present.  No carotid bruit  Cardiovascular: Normal rate, regular rhythm and normal heart sounds.   No murmur heard.  No edema. Pulmonary/Chest: Effort normal and breath sounds normal. No respiratory distress. She has no wheezes. She has no rales.  Breast: deferred   Abdominal: Soft. She exhibits no distension. There is no tenderness.  Lymphadenopathy: She has no cervical adenopathy.  Skin: Skin is warm and dry. She is not diaphoretic.  Psychiatric: She has a normal mood and affect. Her behavior is normal.        Assessment & Plan:   Physical exam: Screening blood work    ordered Immunizations  tdap due, other up to date Gyn   Last one year ago Exercise  Skate 2-3 times a week, walking Weight  Ok - slightly overweight Substance abuse        See Problem List for Assessment and Plan of chronic medical problems.

## 2021-02-25 NOTE — Assessment & Plan Note (Signed)
Resolved after having a wisdom teeth removed

## 2021-02-25 NOTE — Assessment & Plan Note (Signed)
Had an episode of heavy periods recently-have improved some Concern regarding blood counts, iron levels-we will check both today Continue current birth control-refilled for 1 year

## 2021-02-25 NOTE — Patient Instructions (Addendum)
Blood work was ordered.     Medications changes include :  none   Your prescription(s) have been submitted to your pharmacy. Please take as directed and contact our office if you believe you are having problem(s) with the medication(s).       Health Maintenance, Female Adopting a healthy lifestyle and getting preventive care are important in promoting health and wellness. Ask your health care provider about:  The right schedule for you to have regular tests and exams.  Things you can do on your own to prevent diseases and keep yourself healthy. What should I know about diet, weight, and exercise? Eat a healthy diet  Eat a diet that includes plenty of vegetables, fruits, low-fat dairy products, and lean protein.  Do not eat a lot of foods that are high in solid fats, added sugars, or sodium.   Maintain a healthy weight Body mass index (BMI) is used to identify weight problems. It estimates body fat based on height and weight. Your health care provider can help determine your BMI and help you achieve or maintain a healthy weight. Get regular exercise Get regular exercise. This is one of the most important things you can do for your health. Most adults should:  Exercise for at least 150 minutes each week. The exercise should increase your heart rate and make you sweat (moderate-intensity exercise).  Do strengthening exercises at least twice a week. This is in addition to the moderate-intensity exercise.  Spend less time sitting. Even light physical activity can be beneficial. Watch cholesterol and blood lipids Have your blood tested for lipids and cholesterol at 23 years of age, then have this test every 5 years. Have your cholesterol levels checked more often if:  Your lipid or cholesterol levels are high.  You are older than 23 years of age.  You are at high risk for heart disease. What should I know about cancer screening? Depending on your health history and family  history, you may need to have cancer screening at various ages. This may include screening for:  Breast cancer.  Cervical cancer.  Colorectal cancer.  Skin cancer.  Lung cancer. What should I know about heart disease, diabetes, and high blood pressure? Blood pressure and heart disease  High blood pressure causes heart disease and increases the risk of stroke. This is more likely to develop in people who have high blood pressure readings, are of African descent, or are overweight.  Have your blood pressure checked: ? Every 3-5 years if you are 13-34 years of age. ? Every year if you are 28 years old or older. Diabetes Have regular diabetes screenings. This checks your fasting blood sugar level. Have the screening done:  Once every three years after age 74 if you are at a normal weight and have a low risk for diabetes.  More often and at a younger age if you are overweight or have a high risk for diabetes. What should I know about preventing infection? Hepatitis B If you have a higher risk for hepatitis B, you should be screened for this virus. Talk with your health care provider to find out if you are at risk for hepatitis B infection. Hepatitis C Testing is recommended for:  Everyone born from [birth year] through 1963.  Anyone with known risk factors for hepatitis C. Sexually transmitted infections (STIs)  Get screened for STIs, including gonorrhea and chlamydia, if: ? You are sexually active and are younger than 23 years of age. ? You are older  than 23 years of age and your health care provider tells you that you are at risk for this type of infection. ? Your sexual activity has changed since you were last screened, and you are at increased risk for chlamydia or gonorrhea. Ask your health care provider if you are at risk.  Ask your health care provider about whether you are at high risk for HIV. Your health care provider may recommend a prescription medicine to help prevent HIV  infection. If you choose to take medicine to prevent HIV, you should first get tested for HIV. You should then be tested every 3 months for as long as you are taking the medicine. Pregnancy  If you are about to stop having your period (premenopausal) and you may become pregnant, seek counseling before you get pregnant.  Take 400 to 800 micrograms (mcg) of folic acid every day if you become pregnant.  Ask for birth control (contraception) if you want to prevent pregnancy. Osteoporosis and menopause Osteoporosis is a disease in which the bones lose minerals and strength with aging. This can result in bone fractures. If you are 6 years old or older, or if you are at risk for osteoporosis and fractures, ask your health care provider if you should:  Be screened for bone loss.  Take a calcium or vitamin D supplement to lower your risk of fractures.  Be given hormone replacement therapy (HRT) to treat symptoms of menopause. Follow these instructions at home: Lifestyle  Do not use any products that contain nicotine or tobacco, such as cigarettes, e-cigarettes, and chewing tobacco. If you need help quitting, ask your health care provider.  Do not use street drugs.  Do not share needles.  Ask your health care provider for help if you need support or information about quitting drugs. Alcohol use  Do not drink alcohol if: ? Your health care provider tells you not to drink. ? You are pregnant, may be pregnant, or are planning to become pregnant.  If you drink alcohol: ? Limit how much you use to 0-1 drink a day. ? Limit intake if you are breastfeeding.  Be aware of how much alcohol is in your drink. In the [place] one drink equals one 12 oz bottle of beer (355 mL), one 5 oz glass of wine (148 mL), or one 1 oz glass of hard liquor (44 mL). General instructions  Schedule regular health, dental, and eye exams.  Stay current with your vaccines.  Tell your health care provider if: ? You  often feel depressed. ? You have ever been abused or do not feel safe at home. Summary  Adopting a healthy lifestyle and getting preventive care are important in promoting health and wellness.  Follow your health care provider's instructions about healthy diet, exercising, and getting tested or screened for diseases.  Follow your health care provider's instructions on monitoring your cholesterol and blood pressure. This information is not intended to replace advice given to you by your health care provider. Make sure you discuss any questions you have with your health care provider. Document Revised: 11/23/2018 Document Reviewed: 11/23/2018 Elsevier Patient Education  2021 [employer].

## 2021-02-26 LAB — IRON,TIBC AND FERRITIN PANEL
%SAT: 40 % (calc) (ref 16–45)
Ferritin: 32 ng/mL (ref 16–154)
Iron: 131 ug/dL (ref 40–190)
TIBC: 331 mcg/dL (calc) (ref 250–450)

## 2021-02-26 LAB — HIV ANTIBODY (ROUTINE TESTING W REFLEX): HIV 1&2 Ab, 4th Generation: NONREACTIVE

## 2021-02-26 LAB — HEPATITIS C ANTIBODY
Hepatitis C Ab: NONREACTIVE
SIGNAL TO CUT-OFF: 0.01 (ref <1.00)

## 2021-09-02 ENCOUNTER — Telehealth (INDEPENDENT_AMBULATORY_CARE_PROVIDER_SITE_OTHER): Payer: BC Managed Care – PPO | Admitting: Family Medicine

## 2021-09-02 ENCOUNTER — Encounter: Payer: Self-pay | Admitting: Family Medicine

## 2021-09-02 DIAGNOSIS — R21 Rash and other nonspecific skin eruption: Secondary | ICD-10-CM | POA: Diagnosis not present

## 2021-09-02 DIAGNOSIS — R6889 Other general symptoms and signs: Secondary | ICD-10-CM

## 2021-09-02 NOTE — Patient Instructions (Addendum)
   ---------------------------------------------------------------------------------------------------------------------------      WORK SLIP:  Patient Melissa Summers,  02-18-1996, was seen for a medical visit today, 09/02/21 . Please excuse from work for a COVID like illness. We advise 10 days minimum from the onset of symptoms (08/30/21) PLUS 1 day of no fever and improved symptoms. Will defer to employer for a sooner return to work if symptoms have resolved, it is greater than 5 days since the positive test and the patient can wear a high-quality, tight fitting mask such as N95 or KN95 at all times for an additional 5 days. Would also suggest COVID19 antigen testing is negative prior to return.  Sincerely: E-signature: Dr. Kriste Basque, DO [facility] Ph: [phone]   ------------------------------------------------------------------------------------------------------------------------------   HOME CARE TIPS:  Melissa Summers COVID19 testing information: [email or web address] [place] [phone] Most pharmacies also offer testing and home test kits.   -IF the covid test is negative, you should go to a walk in clinic or urgent care for flu/strept testing and further evaluation of the rash.  -can use tylenol or aleve if needed for fevers, aches and pains per instructions  -can use nasal saline a few times per day if you have nasal congestion;   -stay hydrated, drink plenty of fluids and eat small healthy meals - avoid dairy  -can take 1000 IU ( ) Vit D3 and 100-500 mg of Vit C daily per instructions  -If the Covid test is positive, check out the [facility] website for more information on home care, transmission and treatment for COVID19  -follow up with your doctor in 2-3 days unless improving and feeling better  -stay home while sick, except to seek medical care. If you have COVID19, ideally it would be best to stay home for a  full 10 days since the onset of symptoms PLUS one day of no fever and feeling better. Wear a good mask that fits snugly (such as N95 or KN95) if around others to reduce the risk of transmission.  It was nice to meet you today, and I really hope you are feeling better soon. I help Melissa Summers out with telemedicine visits on Tuesdays and Thursdays and am available for visits on those days. If you have any concerns or questions following this visit please schedule a follow up visit with your Primary Care doctor or seek care at a local urgent care clinic to avoid delays in care.    Seek in person care or schedule a follow up video visit promptly if your symptoms worsen, new concerns arise or you are not improving with treatment. Call 911 and/or seek emergency care if your symptoms are severe or life threatening.

## 2021-09-02 NOTE — Progress Notes (Signed)
Virtual Visit via Video Note  I connected with Tresa Endo  on 09/02/21 at 11:00 AM EDT by a video enabled telemedicine application and verified that I am speaking with the correct person using two identifiers.  Location patient: home, [place] Location provider:work or home office Persons participating in the virtual visit: patient, provider  I discussed the limitations of evaluation and management by telemedicine and the availability of in person appointments. The patient expressed understanding and agreed to proceed.   HPI:  Acute telemedicine visit for 3 days ago: -Onset: about 3 days ago -no known sick contacts with HFM, MP, covid, measles, varicella, etc., tick bites -Symptoms include:fever, body aches, HA, sore throat, chills, feet, vomiting, diarrhea -she was at a water park and thinks got some bites on R wrist and R foot and Let foot and ankle - itchy red bumps, one she scratched a lot got some pus and she put antibacterial ointment and now it is much better -Denies: CP, SOB, inability to eat/drink/get out of bed -Has tried:nothing -Pertinent past medical history: see below -Pertinent medication allergies:  Allergies  Allergen Reactions   Amoxicillin Hives  -COVID-19 vaccine status: up to day on covid, varicella and other vaccines -only one sexual partner, has been in situations where she was dancing very close to others -denies any chance of pregnancy, neg pregnancy test last night  ROS: See pertinent positives and negatives per HPI.  Past Medical History:  Diagnosis Date   Perthe's disease of hip right    Past Surgical History:  Procedure Laterality Date   HIP SURGERY Right 2009   perthes disease     Current Outpatient Medications:    VIENVA 0.1-20 MG-MCG tablet, Take 1 tablet by mouth daily., Disp: 84 tablet, Rfl: 3  EXAM:  VITALS per patient if applicable:  GENERAL: alert, oriented, appears well and in no acute distress  HEENT: atraumatic, conjunttiva clear, no  obvious abnormalities on inspection of external nose and ears  NECK: normal movements of the head and neck  LUNGS: on inspection no signs of respiratory distress, breathing rate appears normal, no obvious gross SOB, gasping or wheezing  CV: no obvious cyanosis  SKIN: 3 small erythematous papules R wrist in irr pattern in various healing stages; few scattered erythematous papules ankles/dorsal foot in various stages of healing  MS: moves all visible extremities without noticeable abnormality  PSYCH/NEURO: pleasant and cooperative, no obvious depression or anxiety, speech and thought processing grossly intact  ASSESSMENT AND PLAN:  Discussed the following assessment and plan:  Flu-like symptoms  Rash  -we discussed possible serious and likely etiologies, options for evaluation and workup, limitations of telemedicine visit vs in person visit, treatment, treatment risks and precautions. Query covid, flu, vs other. Likely insect bites on wrist and ankles. Discussed other possible rashes associated with febrile illness as we. She opted to do covid testing and if neg she is considering going to [facility] for further eval rash flu/strep testing et. In interim symptomatic care and work note provided.  Advised to seek prompt in person care if worsening, new symptoms arise, or if is not improving with treatment. Discussed options for inperson care if PCP office not available. Did let this patient know that I only do telemedicine on Tuesdays and Thursdays for [place]. Advised to schedule follow up visit with PCP or [facility] if any further questions or concerns to avoid delays in care.   I discussed the assessment and treatment plan with the patient. The patient was provided an opportunity  to ask questions and all were answered. The patient agreed with the plan and demonstrated an understanding of the instructions.     Lucretia Kern, DO

## 2021-09-05 ENCOUNTER — Encounter: Payer: Self-pay | Admitting: Family Medicine

## 2021-09-05 ENCOUNTER — Telehealth: Payer: BC Managed Care – PPO | Admitting: Internal Medicine

## 2021-09-16 ENCOUNTER — Telehealth: Payer: BC Managed Care – PPO | Admitting: Family Medicine

## 2022-08-11 ENCOUNTER — Encounter: Payer: Self-pay | Admitting: Internal Medicine

## 2022-08-11 NOTE — Patient Instructions (Addendum)
  Blood work was ordered.     Medications changes include :   fosamax 70 mg weekly   Your prescription(s) have been sent to your pharmacy.     Return in about 1 year (around 08/13/2023) for Physical Exam.     Health Maintenance, Female Adopting a healthy lifestyle and getting preventive care are important in promoting health and wellness. Ask your health care provider about: The right schedule for you to have regular tests and exams. Things you can do on your own to prevent diseases and keep yourself healthy. What should I know about diet, weight, and exercise? Eat a healthy diet  Eat a diet that includes plenty of vegetables, fruits, low-fat dairy products, and lean protein. Do not eat a lot of foods that are high in solid fats, added sugars, or sodium. Maintain a healthy weight Body mass index (BMI) is used to identify weight problems. It estimates body fat based on height and weight. Your health care provider can help determine your BMI and help you achieve or maintain a healthy weight. Get regular exercise Get regular exercise. This is one of the most important things you can do for your health. Most adults should: Exercise for at least 150 minutes each week. The exercise should increase your heart rate and make you sweat (moderate-intensity exercise). Do strengthening exercises at least twice a week. This is in addition to the moderate-intensity exercise. Spend less time sitting. Even light physical activity can be beneficial. Watch cholesterol and blood lipids Have your blood tested for lipids and cholesterol at 24 years of age, then have this test every 5 years. Have your cholesterol levels checked more often if: Your lipid or cholesterol levels are high. You are older than 24 years of age. You are at high risk for heart disease. What should I know about cancer screening? Depending on your health history and family history, you may need to have cancer screening at  various ages. This may include screening for: Breast cancer. Cervical cancer. Colorectal cancer. Skin cancer. Lung cancer. What should I know about heart disease, diabetes, and high blood pressure? Blood pressure and heart disease High blood pressure causes heart disease and increases the risk of stroke. This is more likely to develop in people who have high blood pressure readings or are overweight. Have your blood pressure checked: Every 3-5 years if you are 18-39 years of age. Every year if you are 40 years old or older. Diabetes Have regular diabetes screenings. This checks your fasting blood sugar level. Have the screening done: Once every three years after age 40 if you are at a normal weight and have a low risk for diabetes. More often and at a younger age if you are overweight or have a high risk for diabetes. What should I know about preventing infection? Hepatitis B If you have a higher risk for hepatitis B, you should be screened for this virus. Talk with your health care provider to find out if you are at risk for hepatitis B infection. Hepatitis C Testing is recommended for: Everyone born from 1945 through 1965. Anyone with known risk factors for hepatitis C. Sexually transmitted infections (STIs) Get screened for STIs, including gonorrhea and chlamydia, if: You are sexually active and are younger than 24 years of age. You are older than 24 years of age and your health care provider tells you that you are at risk for this type of infection. Your sexual activity has changed since you were last   screened, and you are at increased risk for chlamydia or gonorrhea. Ask your health care provider if you are at risk. Ask your health care provider about whether you are at high risk for HIV. Your health care provider may recommend a prescription medicine to help prevent HIV infection. If you choose to take medicine to prevent HIV, you should first get tested for HIV. You should then be  tested every 3 months for as long as you are taking the medicine. Pregnancy If you are about to stop having your period (premenopausal) and you may become pregnant, seek counseling before you get pregnant. Take 400 to 800 micrograms (mcg) of folic acid every day if you become pregnant. Ask for birth control (contraception) if you want to prevent pregnancy. Osteoporosis and menopause Osteoporosis is a disease in which the bones lose minerals and strength with aging. This can result in bone fractures. If you are 65 years old or older, or if you are at risk for osteoporosis and fractures, ask your health care provider if you should: Be screened for bone loss. Take a calcium or vitamin D supplement to lower your risk of fractures. Be given hormone replacement therapy (HRT) to treat symptoms of menopause. Follow these instructions at home: Alcohol use Do not drink alcohol if: Your health care provider tells you not to drink. You are pregnant, may be pregnant, or are planning to become pregnant. If you drink alcohol: Limit how much you have to: 0-1 drink a day. Know how much alcohol is in your drink. In the [place] one drink equals one 12 oz bottle of beer (355 mL), one 5 oz glass of wine (148 mL), or one 1 oz glass of hard liquor (44 mL). Lifestyle Do not use any products that contain nicotine or tobacco. These products include cigarettes, chewing tobacco, and vaping devices, such as e-cigarettes. If you need help quitting, ask your health care provider. Do not use street drugs. Do not share needles. Ask your health care provider for help if you need support or information about quitting drugs. General instructions Schedule regular health, dental, and eye exams. Stay current with your vaccines. Tell your health care provider if: You often feel depressed. You have ever been abused or do not feel safe at home. Summary Adopting a healthy lifestyle and getting preventive care are important in  promoting health and wellness. Follow your health care provider's instructions about healthy diet, exercising, and getting tested or screened for diseases. Follow your health care provider's instructions on monitoring your cholesterol and blood pressure. This information is not intended to replace advice given to you by your health care provider. Make sure you discuss any questions you have with your health care provider. Document Revised: 04/21/2021 Document Reviewed: 04/21/2021 Elsevier Patient Education  2023 [employer]  

## 2022-08-11 NOTE — Progress Notes (Unsigned)
    Subjective:    Patient ID: Melissa Summers, female    DOB: 09/01/1996, 26 y.o.   MRN: [id number]      HPI Melissa Summers is here for a Physical exam.      Medications and allergies reviewed with patient and updated if appropriate.  Current Outpatient Medications on File Prior to Visit  Medication Sig Dispense Refill  . VIENVA 0.1-20 MG-MCG tablet Take 1 tablet by mouth daily. 84 tablet 3   No current facility-administered medications on file prior to visit.    Review of Systems     Objective:  There were no vitals filed for this visit. There were no vitals filed for this visit. There is no height or weight on file to calculate BMI.  BP Readings from Last 3 Encounters:  02/25/21 108/70  06/29/18 116/78  05/14/17 110/76    Wt Readings from Last 3 Encounters:  02/25/21 157 lb (71.2 kg)  06/29/18 143 lb (64.9 kg) (73 %, Z= 0.62)*  05/14/17 139 lb (63 kg) (72 %, Z= 0.59)*   * Growth percentiles are based on CDC (Girls, 2-20 Years) data.       Physical Exam Constitutional: She appears well-developed and well-nourished. No distress.  HENT:  Head: Normocephalic and atraumatic.  Right Ear: External ear normal. Normal ear canal and TM Left Ear: External ear normal.  Normal ear canal and TM Mouth/Throat: Oropharynx is clear and moist.  Eyes: Conjunctivae normal.  Neck: Neck supple. No tracheal deviation present. No thyromegaly present.  No carotid bruit  Cardiovascular: Normal rate, regular rhythm and normal heart sounds.   No murmur heard.  No edema. Pulmonary/Chest: Effort normal and breath sounds normal. No respiratory distress. She has no wheezes. She has no rales.  Breast: deferred   Abdominal: Soft. She exhibits no distension. There is no tenderness.  Lymphadenopathy: She has no cervical adenopathy.  Skin: Skin is warm and dry. She is not diaphoretic.  Psychiatric: She has a normal mood and affect. Her behavior is normal.     Lab Results  Component Value  Date   WBC 4.0 02/25/2021   HGB 14.2 02/25/2021   HCT 40.1 02/25/2021   PLT 245.0 02/25/2021   GLUCOSE 89 02/25/2021   CHOL 141 02/25/2021   TRIG 55.0 02/25/2021   HDL 72.80 02/25/2021   LDLCALC 58 02/25/2021   ALT 15 02/25/2021   AST 20 02/25/2021   NA 138 02/25/2021   K 3.9 02/25/2021   CL 105 02/25/2021   CREATININE 0.87 02/25/2021   BUN 11 02/25/2021   CO2 27 02/25/2021   TSH 1.63 02/25/2021         Assessment & Plan:   Physical exam: Screening blood work  ordered Exercise   Weight   Substance abuse  none   Reviewed recommended immunizations.   Health Maintenance  Topic Date Due  . TETANUS/TDAP  09/25/2019  . COVID-19 Vaccine (4 - Pfizer series) 02/26/2021  . INFLUENZA VACCINE  07/14/2022  . CHLAMYDIA SCREENING  11/12/2022  . PAP SMEAR-Modifier  11/12/2022  . PAP-Cervical Cytology Screening  11/12/2024  . HPV VACCINES  Completed  . Hepatitis C Screening  Completed  . HIV Screening  Completed          See Problem List for Assessment and Plan of chronic medical problems.     This encounter was created in error - please disregard.

## 2022-08-12 ENCOUNTER — Encounter: Payer: BC Managed Care – PPO | Admitting: Internal Medicine

## 2022-08-12 DIAGNOSIS — Z0000 Encounter for general adult medical examination without abnormal findings: Secondary | ICD-10-CM
